# Patient Record
Sex: Male | Born: 1965 | Race: White | Hispanic: No | Marital: Married
Health system: Southern US, Community
[De-identification: ages and names within clinical notes are randomized; demographics above are authoritative.]

---

## 2006-11-14 ENCOUNTER — Emergency Department: Payer: Self-pay | Admitting: Emergency Medicine

## 2006-11-14 ENCOUNTER — Other Ambulatory Visit: Payer: Self-pay

## 2006-11-15 ENCOUNTER — Other Ambulatory Visit: Payer: Self-pay

## 2008-09-02 IMAGING — CT CT CHEST W/ CM
2 series · 16 of 32 positions shown, 20 images · IV contrast (APPLIED)
Comparison: none

REASON FOR EXAM: rm 19  chest pain
COMMENTS:

PROCEDURE:     CT  - CT CHEST (FOR PE) W  - November 15, 2006 [DATE]
RESULT:
HISTORY: Chest pain.

[Series 5: soft tissue · axial · 0.88mm/px · z∈[-746,-692]mm · 2 of 116 slices shown]
[im 9/116  mediastinal]
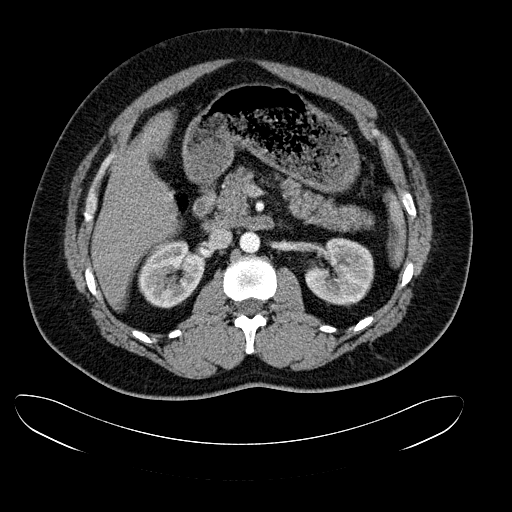
[im 27/116  mediastinal]
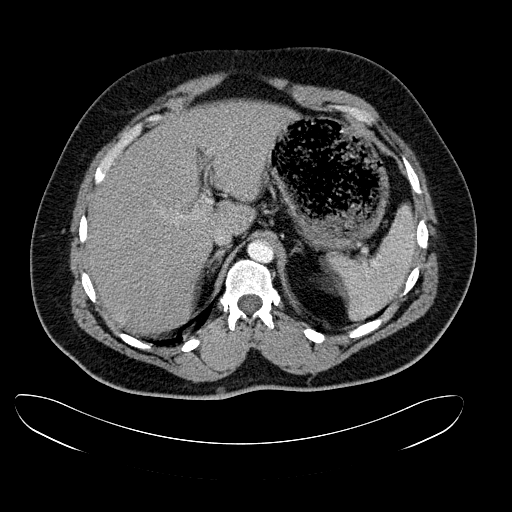

[Series 6: lung windows · axial · 0.88mm/px · z∈[-736,-452]mm · 14 of 113 slices shown, 18 images]
[im 9/113  mediastinal]
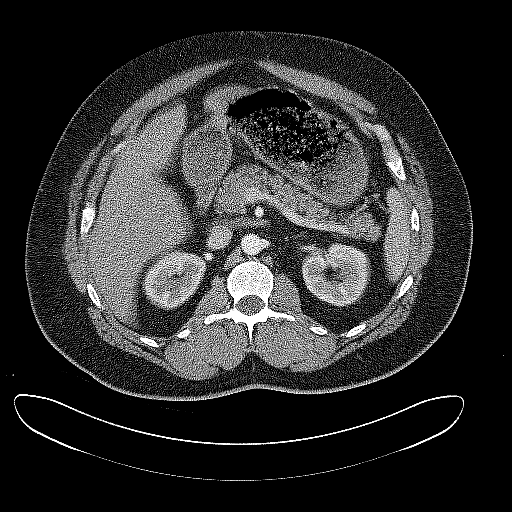
[im 9/113  lung]
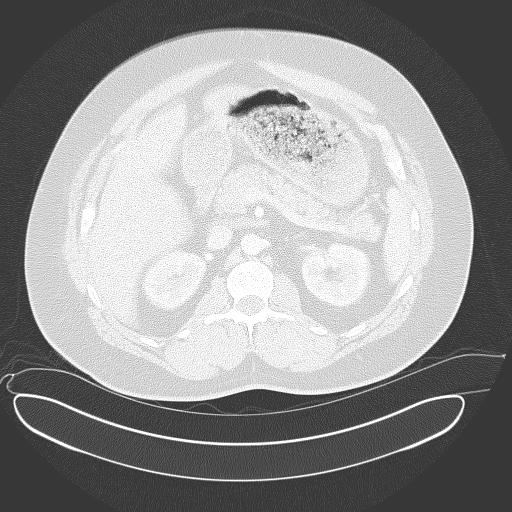
[im 18/113  lung]
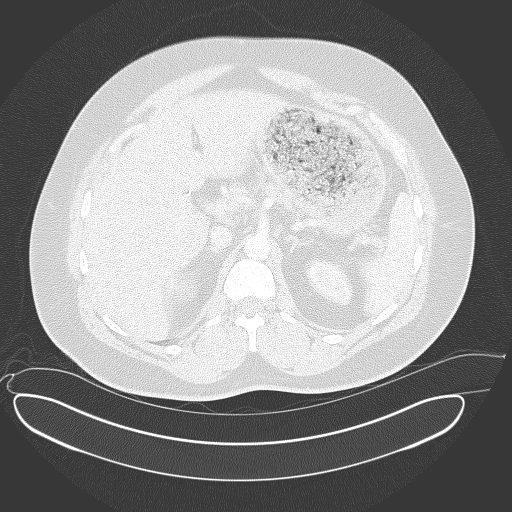
[im 26/113  lung]
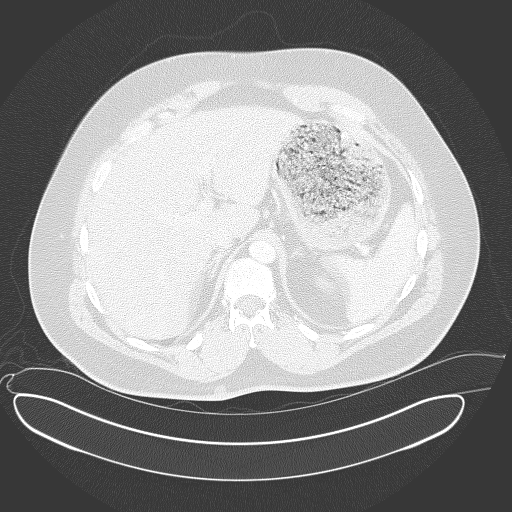
[im 35/113  lung]
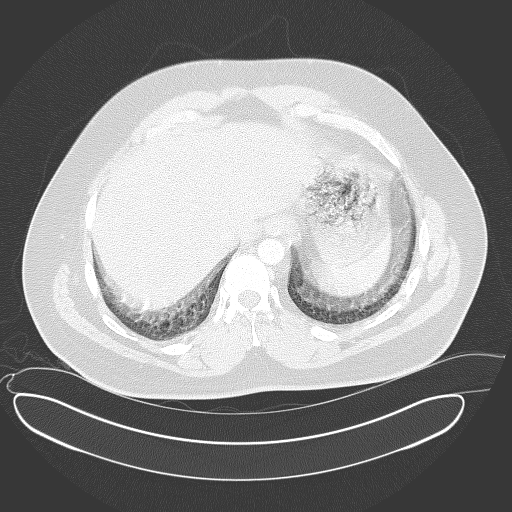
[im 44/113  mediastinal]
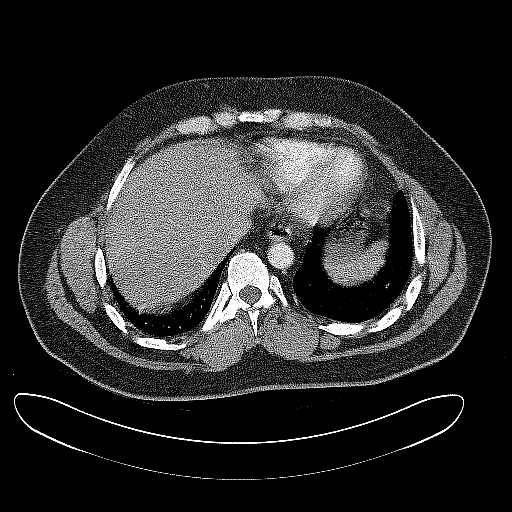
[im 44/113  lung]
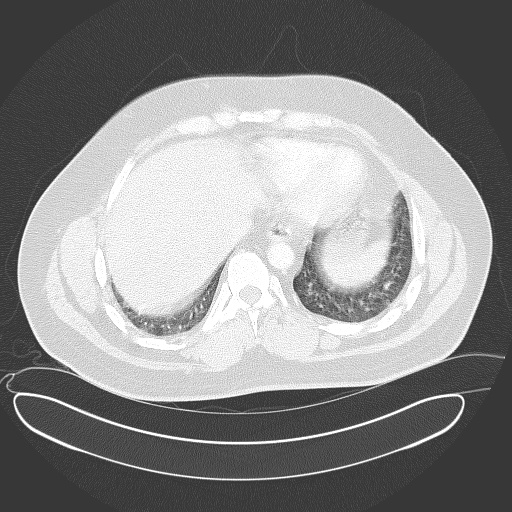
[im 52/113  lung]
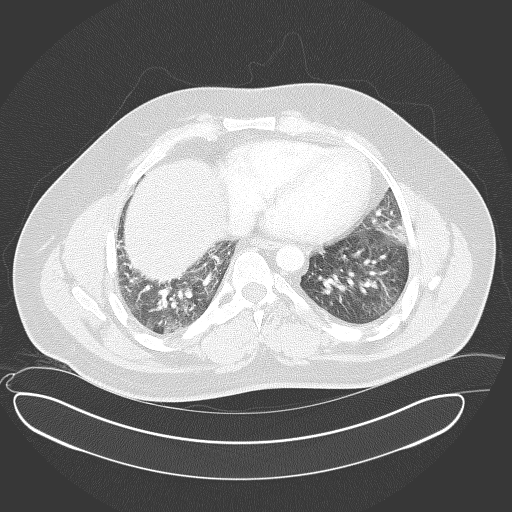
[im 53/113  lung]
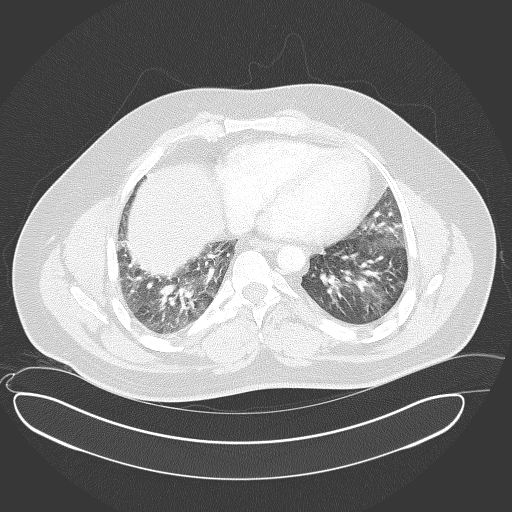
[im 57/113  lung]
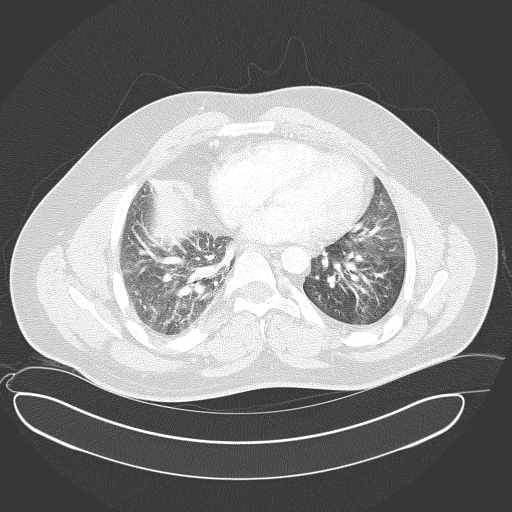
[im 61/113  mediastinal]
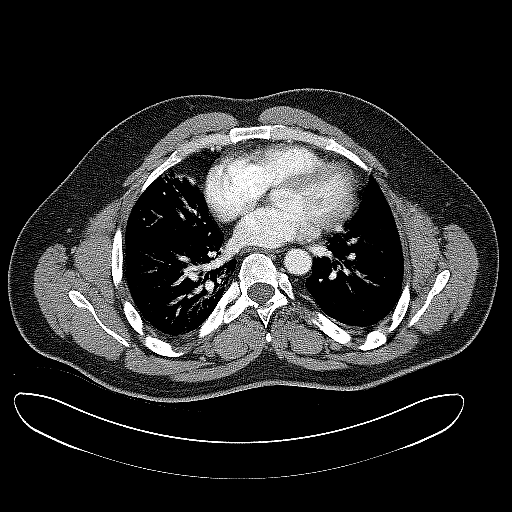
[im 61/113  lung]
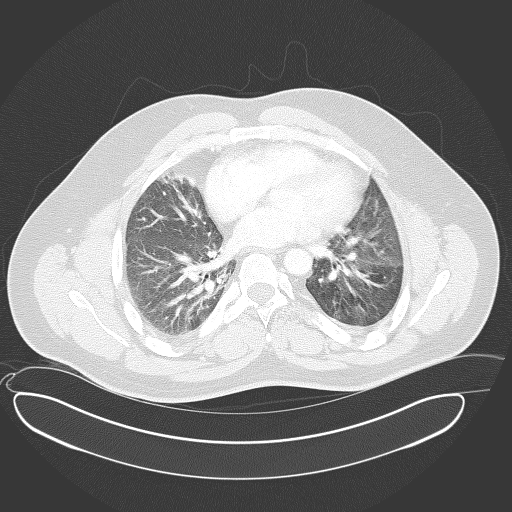
[im 69/113  lung]
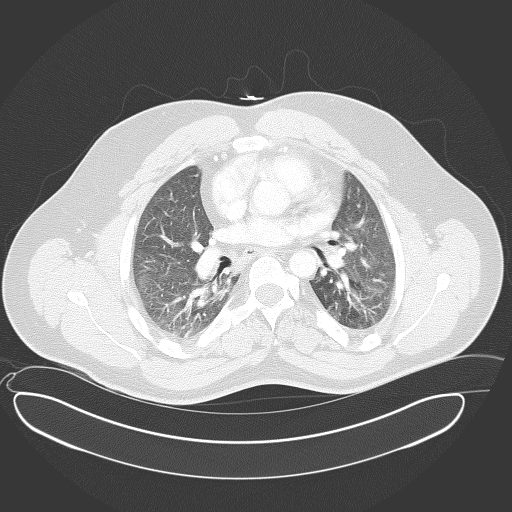
[im 78/113  lung]
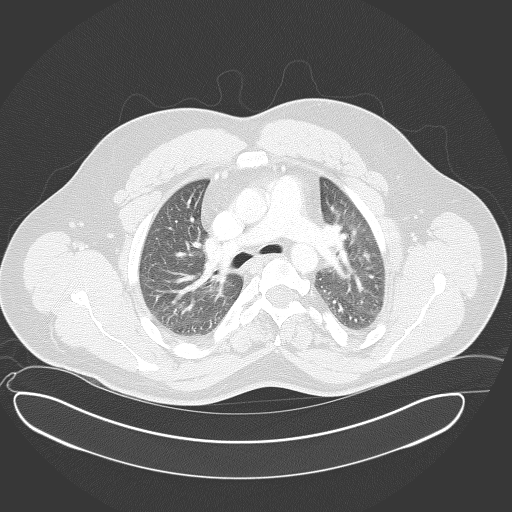
[im 87/113  lung]
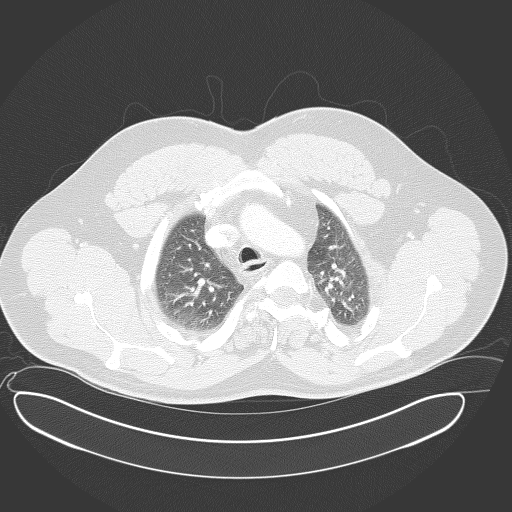
[im 95/113  mediastinal]
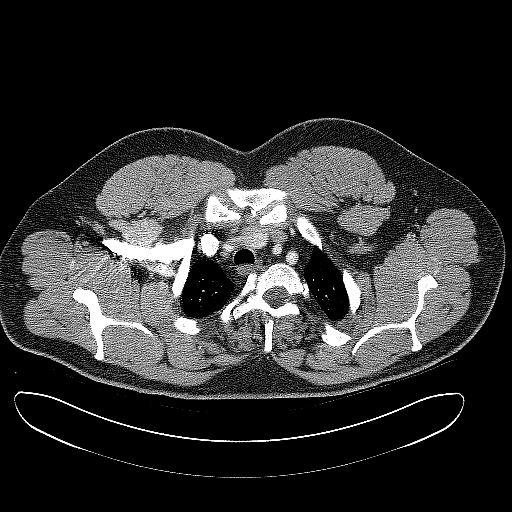
[im 95/113  lung]
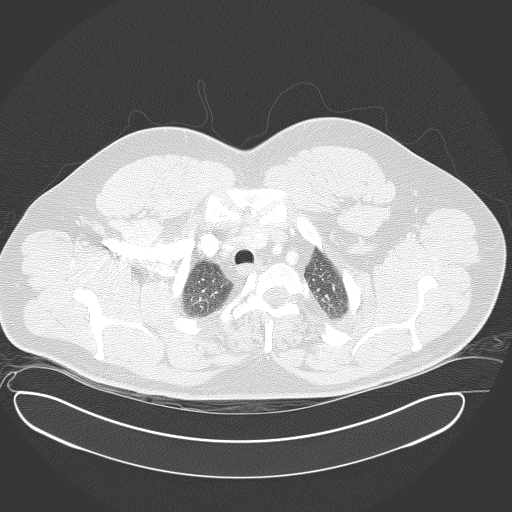
[im 104/113  lung]
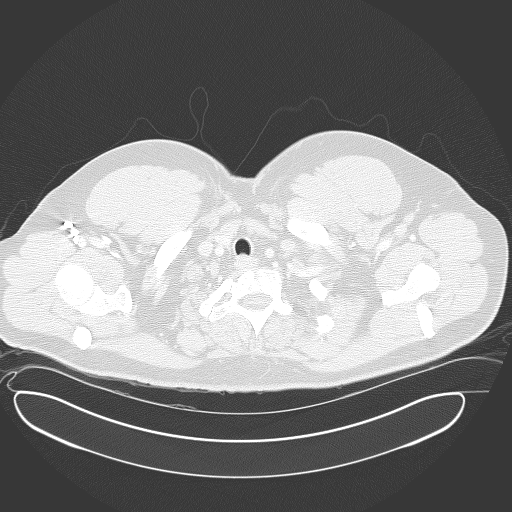

[16 of 32 positions shown; findings below may reference images not displayed]

FINDINGS: IV contrast enhanced chest CT is obtained.  The pulmonary
arteries are normal.  The heart size is normal.  No evidence of infiltrate
noted.  There is basilar atelectasis.  The adrenals are normal.
IMPRESSION: No evidence of pulmonary embolus.  Mild basilar atelectasis.

Initial report was faxed to the Emergency Room physician at the time of the
study.

## 2009-07-02 ENCOUNTER — Ambulatory Visit: Payer: Self-pay | Admitting: Unknown Physician Specialty

## 2009-07-09 ENCOUNTER — Ambulatory Visit: Payer: Self-pay | Admitting: Gastroenterology

## 2011-04-20 IMAGING — CT CT ABD-PELV W/ CM
1 of 2 series · 14 of 32 positions shown, 18 images · non-contrast
Comparison: none

REASON FOR EXAM: RLQ pain change in bowel habits diarrhea rectal bleeding
COMMENTS:

[Series 2: abdomen · axial · 0.87mm/px · z∈[-560,-145]mm · 14 of 91 slices shown, 18 images]
[im 4/91  soft-tissue]
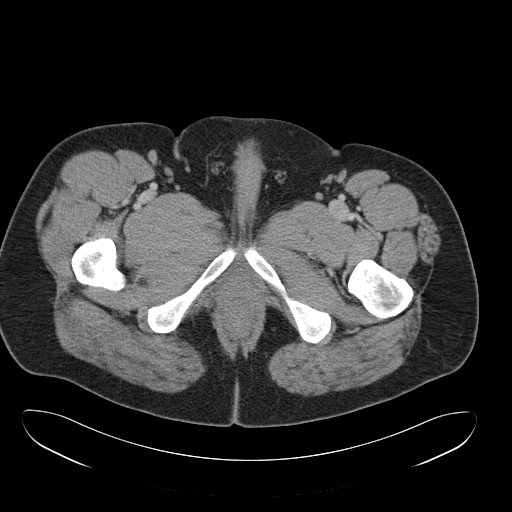
[im 4/91  bone]
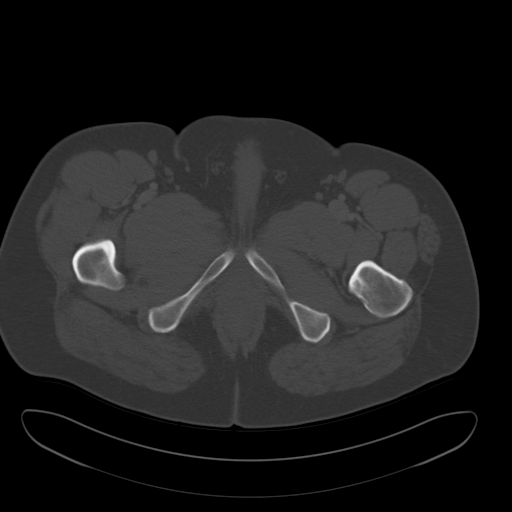
[im 12/91  soft-tissue]
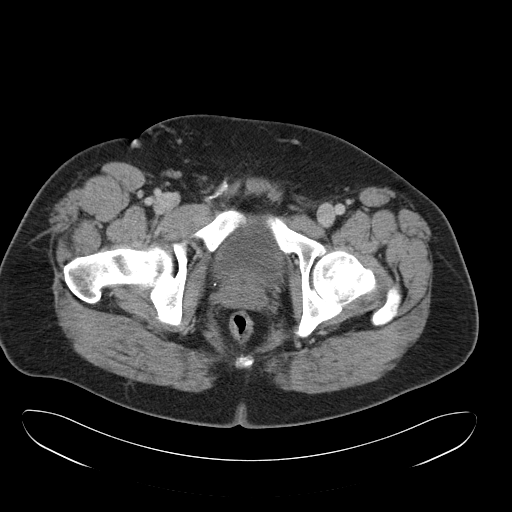
[im 19/91  soft-tissue]
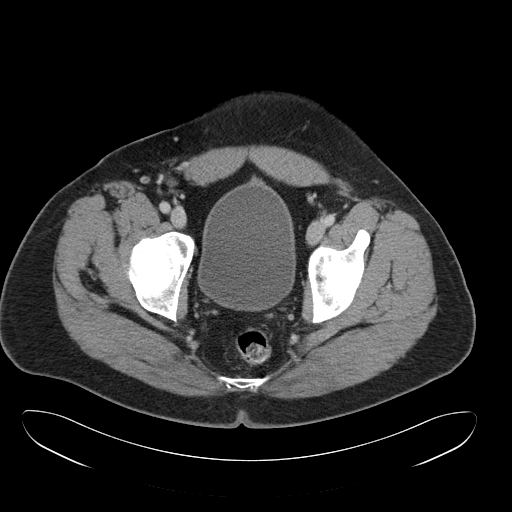
[im 27/91  soft-tissue]
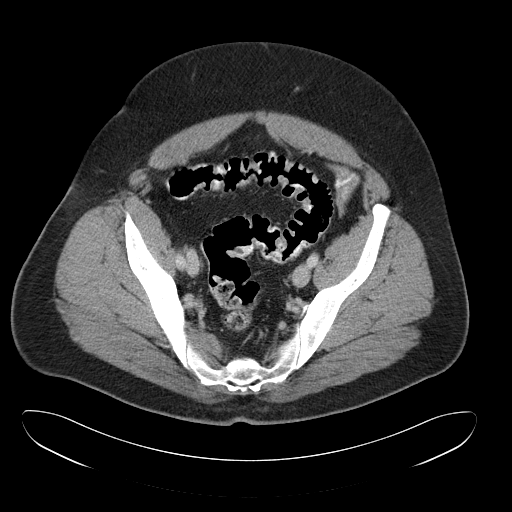
[im 34/91  soft-tissue]
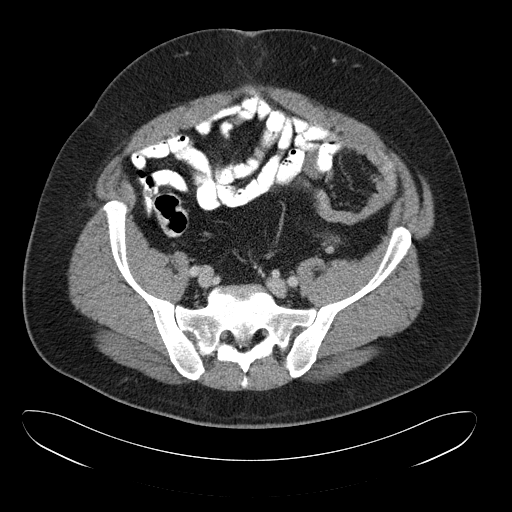
[im 42/91  soft-tissue]
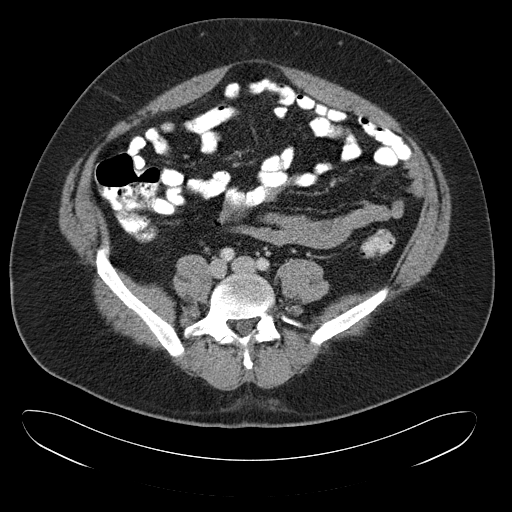
[im 49/91  soft-tissue]
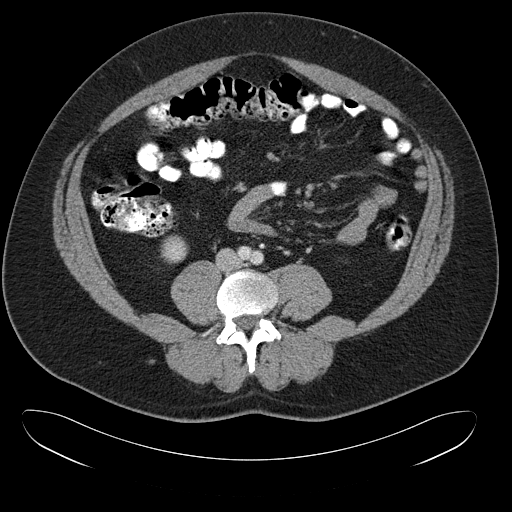
[im 57/91  soft-tissue]
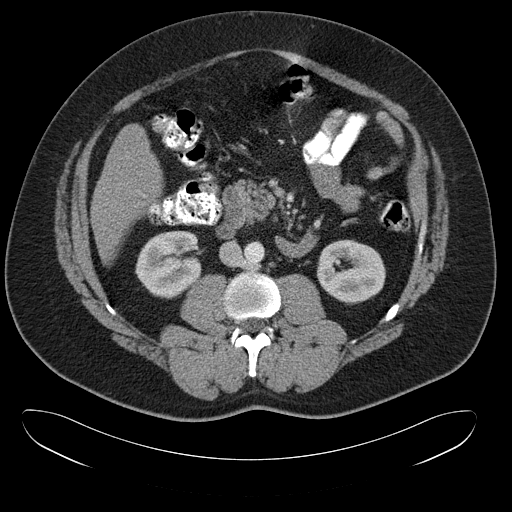
[im 64/91  soft-tissue]
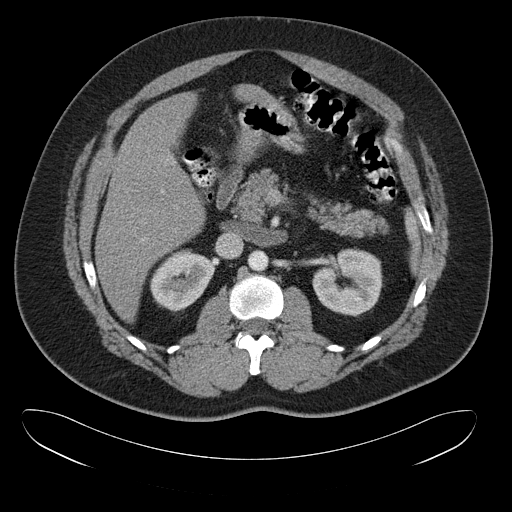
[im 64/91  bone]
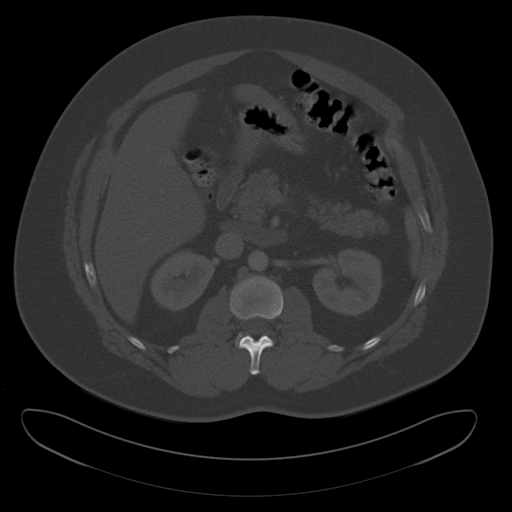
[im 72/91  soft-tissue]
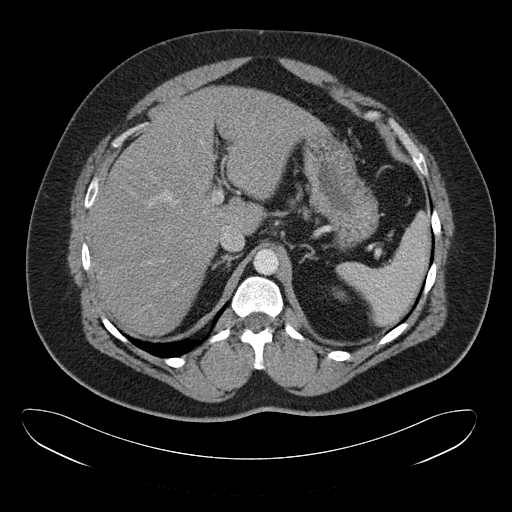
[im 76/91  lung]
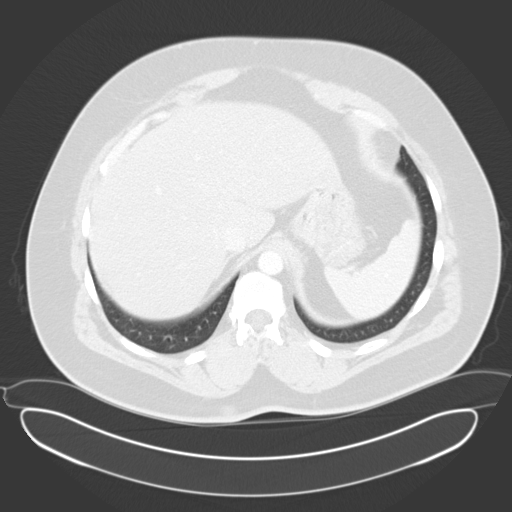
[im 79/91  soft-tissue]
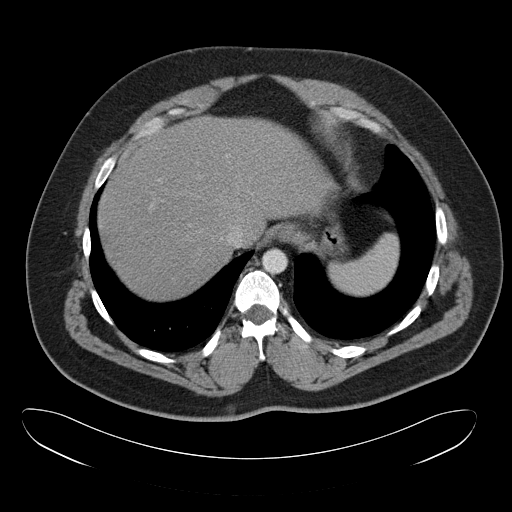
[im 79/91  lung]
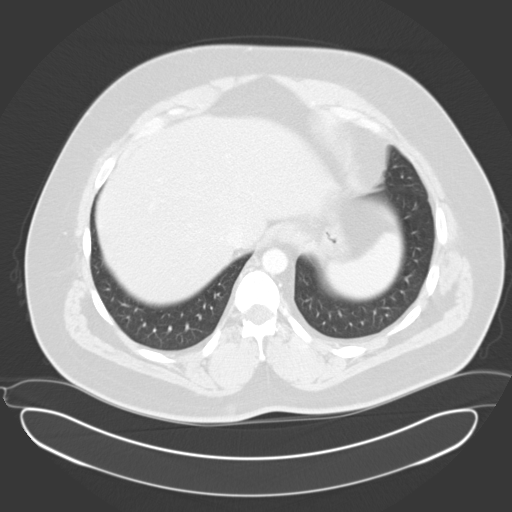
[im 83/91  lung]
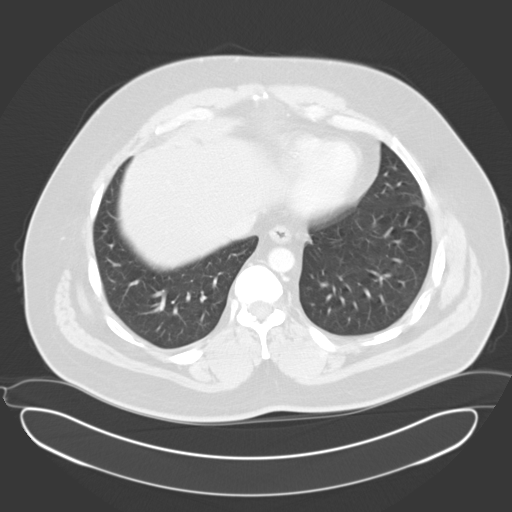
[im 87/91  soft-tissue]
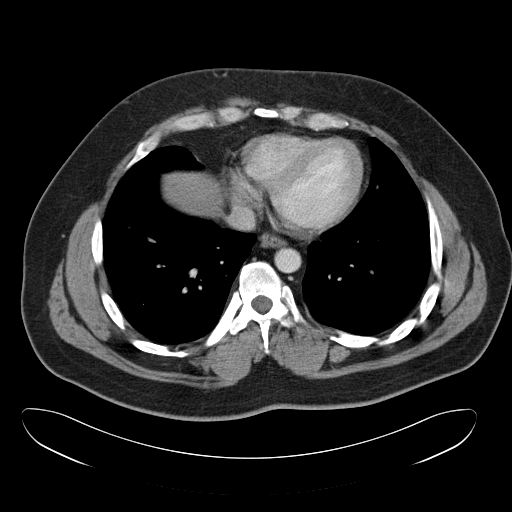
[im 87/91  lung]
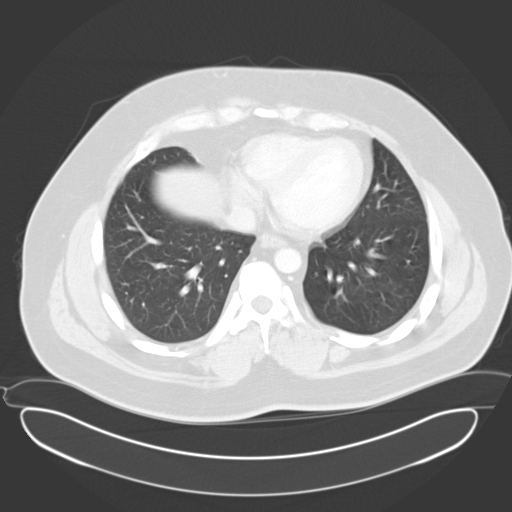

[14 of 32 positions shown; findings below may reference images not displayed]

PROCEDURE:     CT  - CT ABDOMEN / PELVIS  W  - July 02, 2009  [DATE]

RESULT:     Axial CT scanning was performed through the abdomen and pelvis
at 5 mm intervals and slice thicknesses following intravenous administration
of 100 cc of Isovue 370 and administration of oral contrast material. Review
of 3-dimensional reconstructed images was performed separately on the
[employer] monitor.

The liver exhibits normal density with no focal mass nor ductal dilation.
The gallbladder surgically absent. The pancreas, spleen, nondistended
stomach, adrenal glands, and kidneys exhibit no acute abnormality. There is
a nonobstructing 3 mm diameter stone in a mid pole calyx on the right. There
is an approximately 2 mm diameter nonobstructing stone in a mid pole calyx
on the left. The caliber of the abdominal aorta is normal.

The partially contrast-filled loops of small bowel are normal in appearance.
The cecum contains stool and contrast and is normal in contour. The appendix
is not discretely identified. There is radiodense material in the right
lower quadrant of the abdomen which may reflect surgical clips. The
transverse colon and ascending colon exhibit no acute abnormality. Minimal
thickening of the wall of the sigmoid colon is noted but I do not see
evidence of perisigmoidal inflammatory change. No more than a few
diverticula are demonstrated. There is no free fluid in the pelvis. The
partially distended urinary bladder is normal in appearance. The prostate
gland and seminal vesicles appear normal. I see no inguinal hernia. A
broadly neck umbilical hernia is present containing loops of small bowel but
there is no evidence of obstruction. The lumbar vertebral bodies are
preserved in height. The lung bases are clear.
IMPRESSION: 1. I do not see objective evidence of acute appendicitis nor acute
diverticulitis. I cannot exclude low-grade diverticulitis of the sigmoid
colon in the appropriate clinical setting, but no abscess or perforation or
obstruction is seen.
2. There is a broadly necked umbilical hernia containing fat and loops of
normal appearing contrast-filled small bowel.
3. I do not see evidence of acute hepatobiliary abnormality. The gallbladder
is surgically absent.
4. There are nonobstructing stones within both kidneys.

## 2019-07-04 ENCOUNTER — Encounter: Payer: Self-pay | Admitting: *Deleted

## 2019-07-04 ENCOUNTER — Ambulatory Visit: Payer: BLUE CROSS/BLUE SHIELD | Admitting: Certified Registered Nurse Anesthetist

## 2019-07-04 ENCOUNTER — Encounter
Admission: RE | Disposition: A | Discharge status: Home / Self Care | Payer: Self-pay | Source: Ambulatory Visit | Attending: Surgery

## 2019-07-04 ENCOUNTER — Ambulatory Visit
Admission: RE | Admit: 2019-07-04 | Discharge: 2019-07-04 | Disposition: A | Discharge status: Home / Self Care | Payer: BLUE CROSS/BLUE SHIELD | Source: Ambulatory Visit | Attending: Surgery | Admitting: Surgery

## 2019-07-04 ENCOUNTER — Other Ambulatory Visit: Payer: Self-pay

## 2019-07-04 ENCOUNTER — Ambulatory Visit: Payer: Self-pay | Admitting: Surgery

## 2019-07-04 ENCOUNTER — Other Ambulatory Visit
Admission: RE | Admit: 2019-07-04 | Discharge: 2019-07-04 | Disposition: A | Discharge status: Home / Self Care | Payer: BLUE CROSS/BLUE SHIELD | Source: Ambulatory Visit | Attending: Surgery | Admitting: Surgery

## 2019-07-04 DIAGNOSIS — F17290 Nicotine dependence, other tobacco product, uncomplicated: Secondary | ICD-10-CM | POA: Diagnosis not present

## 2019-07-04 DIAGNOSIS — L02212 Cutaneous abscess of back [any part, except buttock]: Secondary | ICD-10-CM | POA: Diagnosis not present

## 2019-07-04 DIAGNOSIS — Z20828 Contact with and (suspected) exposure to other viral communicable diseases: Secondary | ICD-10-CM | POA: Diagnosis not present

## 2019-07-04 HISTORY — PX: INCISION AND DRAINAGE ABSCESS: SHX5864

## 2019-07-04 LAB — SARS CORONAVIRUS 2 BY RT PCR (HOSPITAL ORDER, PERFORMED IN ~~LOC~~ HOSPITAL LAB): SARS Coronavirus 2: NEGATIVE

## 2019-07-04 SURGERY — INCISION AND DRAINAGE, ABSCESS
Anesthesia: General | Site: Back

## 2019-07-04 MED ORDER — FAMOTIDINE 20 MG PO TABS
ORAL_TABLET | ORAL | Status: AC
  Administered 2019-07-04: 20 mg via ORAL
  Filled 2019-07-04: qty 1

## 2019-07-04 MED ORDER — FAMOTIDINE 20 MG PO TABS
20.0000 mg | ORAL_TABLET | Freq: Once | ORAL | Status: AC
  Administered 2019-07-04: 13:00:00 20 mg via ORAL

## 2019-07-04 MED ORDER — PROPOFOL 10 MG/ML IV BOLUS
INTRAVENOUS | Status: AC
  Filled 2019-07-04: qty 20

## 2019-07-04 MED ORDER — LIDOCAINE HCL 1 % IJ SOLN
INTRAMUSCULAR | Status: DC | PRN
  Administered 2019-07-04: 5 mL

## 2019-07-04 MED ORDER — ROCURONIUM BROMIDE 50 MG/5ML IV SOLN
INTRAVENOUS | Status: AC
  Filled 2019-07-04: qty 1

## 2019-07-04 MED ORDER — SUGAMMADEX SODIUM 500 MG/5ML IV SOLN
INTRAVENOUS | Status: DC | PRN
  Administered 2019-07-04: 200 mg via INTRAVENOUS

## 2019-07-04 MED ORDER — OXYCODONE HCL 5 MG PO TABS
5.0000 mg | ORAL_TABLET | Freq: Once | ORAL | Status: AC | PRN
  Administered 2019-07-04: 5 mg via ORAL

## 2019-07-04 MED ORDER — CEFAZOLIN SODIUM-DEXTROSE 2-4 GM/100ML-% IV SOLN
INTRAVENOUS | Status: AC
  Filled 2019-07-04: qty 100

## 2019-07-04 MED ORDER — CEFAZOLIN SODIUM-DEXTROSE 2-4 GM/100ML-% IV SOLN
2.0000 g | INTRAVENOUS | Status: AC
  Administered 2019-07-04: 2 g via INTRAVENOUS

## 2019-07-04 MED ORDER — OXYCODONE HCL 5 MG/5ML PO SOLN: 5.0000 mg | Freq: Once | ORAL | Status: AC | PRN

## 2019-07-04 MED ORDER — SUCCINYLCHOLINE CHLORIDE 20 MG/ML IJ SOLN
INTRAMUSCULAR | Status: DC | PRN
  Administered 2019-07-04: 120 mg via INTRAVENOUS

## 2019-07-04 MED ORDER — LIDOCAINE HCL (PF) 2 % IJ SOLN
INTRAMUSCULAR | Status: AC
  Filled 2019-07-04: qty 10

## 2019-07-04 MED ORDER — MIDAZOLAM HCL 2 MG/2ML IJ SOLN
INTRAMUSCULAR | Status: AC
  Filled 2019-07-04: qty 2

## 2019-07-04 MED ORDER — OXYCODONE HCL 5 MG PO TABS
ORAL_TABLET | ORAL | Status: AC
  Filled 2019-07-04: qty 1

## 2019-07-04 MED ORDER — DOCUSATE SODIUM 100 MG PO CAPS: 100.0000 mg | ORAL_CAPSULE | Freq: Two times a day (BID) | ORAL | 0 refills | Status: AC | PRN

## 2019-07-04 MED ORDER — FENTANYL CITRATE (PF) 100 MCG/2ML IJ SOLN
INTRAMUSCULAR | Status: AC
  Filled 2019-07-04: qty 2

## 2019-07-04 MED ORDER — ACETAMINOPHEN 325 MG PO TABS: 650.0000 mg | ORAL_TABLET | Freq: Three times a day (TID) | ORAL | 0 refills | Status: AC | PRN

## 2019-07-04 MED ORDER — ROCURONIUM BROMIDE 50 MG/5ML IV SOLN
INTRAVENOUS | Status: AC
  Filled 2019-07-04: qty 2

## 2019-07-04 MED ORDER — FENTANYL CITRATE (PF) 100 MCG/2ML IJ SOLN: 25.0000 ug | INTRAMUSCULAR | Status: DC | PRN

## 2019-07-04 MED ORDER — PROMETHAZINE HCL 25 MG/ML IJ SOLN: 6.2500 mg | INTRAMUSCULAR | Status: DC | PRN

## 2019-07-04 MED ORDER — MIDAZOLAM HCL 2 MG/2ML IJ SOLN
INTRAMUSCULAR | Status: DC | PRN
  Administered 2019-07-04: 2 mg via INTRAVENOUS

## 2019-07-04 MED ORDER — HYDROCODONE-ACETAMINOPHEN 5-325 MG PO TABS: 1.0000 | ORAL_TABLET | Freq: Four times a day (QID) | ORAL | 0 refills | Status: DC | PRN

## 2019-07-04 MED ORDER — ONDANSETRON HCL 4 MG/2ML IJ SOLN
INTRAMUSCULAR | Status: AC
  Filled 2019-07-04: qty 2

## 2019-07-04 MED ORDER — ONDANSETRON HCL 4 MG/2ML IJ SOLN
INTRAMUSCULAR | Status: DC | PRN
  Administered 2019-07-04: 4 mg via INTRAVENOUS

## 2019-07-04 MED ORDER — IBUPROFEN 800 MG PO TABS: 800.0000 mg | ORAL_TABLET | Freq: Three times a day (TID) | ORAL | 0 refills | Status: AC | PRN

## 2019-07-04 MED ORDER — DEXAMETHASONE SODIUM PHOSPHATE 10 MG/ML IJ SOLN
INTRAMUSCULAR | Status: AC
  Filled 2019-07-04: qty 2

## 2019-07-04 MED ORDER — LIDOCAINE HCL (PF) 1 % IJ SOLN
INTRAMUSCULAR | Status: AC
  Filled 2019-07-04: qty 30

## 2019-07-04 MED ORDER — ROCURONIUM BROMIDE 100 MG/10ML IV SOLN
INTRAVENOUS | Status: DC | PRN
  Administered 2019-07-04: 20 mg via INTRAVENOUS
  Administered 2019-07-04: 5 mg via INTRAVENOUS

## 2019-07-04 MED ORDER — DEXAMETHASONE SODIUM PHOSPHATE 10 MG/ML IJ SOLN
INTRAMUSCULAR | Status: DC | PRN
  Administered 2019-07-04: 10 mg via INTRAVENOUS

## 2019-07-04 MED ORDER — PROPOFOL 10 MG/ML IV BOLUS
INTRAVENOUS | Status: DC | PRN
  Administered 2019-07-04: 200 mg via INTRAVENOUS

## 2019-07-04 MED ORDER — ONDANSETRON HCL 4 MG/2ML IJ SOLN
INTRAMUSCULAR | Status: AC
  Filled 2019-07-04: qty 4

## 2019-07-04 MED ORDER — LACTATED RINGERS IV SOLN
INTRAVENOUS | Status: DC
  Administered 2019-07-04: 13:00:00 via INTRAVENOUS

## 2019-07-04 MED ORDER — BUPIVACAINE-EPINEPHRINE (PF) 0.25% -1:200000 IJ SOLN
INTRAMUSCULAR | Status: AC
  Filled 2019-07-04: qty 30

## 2019-07-04 MED ORDER — ESMOLOL HCL 100 MG/10ML IV SOLN
INTRAVENOUS | Status: DC | PRN
  Administered 2019-07-04 (×2): 10 mg via INTRAVENOUS

## 2019-07-04 MED ORDER — DEXMEDETOMIDINE HCL 200 MCG/2ML IV SOLN
INTRAVENOUS | Status: DC | PRN
  Administered 2019-07-04: 4 ug via INTRAVENOUS

## 2019-07-04 MED ORDER — CHLORHEXIDINE GLUCONATE CLOTH 2 % EX PADS: 6.0000 | MEDICATED_PAD | Freq: Once | CUTANEOUS | Status: DC

## 2019-07-04 MED ORDER — SUCCINYLCHOLINE CHLORIDE 20 MG/ML IJ SOLN
INTRAMUSCULAR | Status: AC
  Filled 2019-07-04: qty 2

## 2019-07-04 MED ORDER — FENTANYL CITRATE (PF) 100 MCG/2ML IJ SOLN
INTRAMUSCULAR | Status: DC | PRN
  Administered 2019-07-04 (×2): 50 ug via INTRAVENOUS

## 2019-07-04 MED ORDER — ALBUTEROL SULFATE HFA 108 (90 BASE) MCG/ACT IN AERS
INHALATION_SPRAY | RESPIRATORY_TRACT | Status: DC | PRN
  Administered 2019-07-04: 4 via RESPIRATORY_TRACT

## 2019-07-04 MED ORDER — LIDOCAINE HCL (CARDIAC) PF 100 MG/5ML IV SOSY
PREFILLED_SYRINGE | INTRAVENOUS | Status: DC | PRN
  Administered 2019-07-04: 100 mg via INTRAVENOUS

## 2019-07-04 MED ORDER — BUPIVACAINE-EPINEPHRINE 0.25% -1:200000 IJ SOLN
INTRAMUSCULAR | Status: DC | PRN
  Administered 2019-07-04: 5 mL

## 2019-07-04 MED ORDER — MEPERIDINE HCL 50 MG/ML IJ SOLN: 6.2500 mg | INTRAMUSCULAR | Status: DC | PRN

## 2019-07-04 SURGICAL SUPPLY — 28 items
BLADE SURG 15 STRL LF DISP TIS (BLADE) ×1 IMPLANT
BLADE SURG 15 STRL SS (BLADE) ×2
CANISTER SUCT 1200ML W/VALVE (MISCELLANEOUS) ×3 IMPLANT
CHLORAPREP W/TINT 26 (MISCELLANEOUS) ×3 IMPLANT
COVER WAND RF STERILE (DRAPES) ×3 IMPLANT
DRAPE LAPAROTOMY 100X77 ABD (DRAPES) ×3 IMPLANT
ELECT REM PT RETURN 9FT ADLT (ELECTROSURGICAL) ×3
ELECTRODE REM PT RTRN 9FT ADLT (ELECTROSURGICAL) ×1 IMPLANT
GAUZE PACKING IODOFORM 1/2 (PACKING) IMPLANT
GAUZE PACKING IODOFORM 1X5 (MISCELLANEOUS) ×3 IMPLANT
GAUZE SPONGE 4X4 12PLY STRL (GAUZE/BANDAGES/DRESSINGS) ×3 IMPLANT
GLOVE BIOGEL PI IND STRL 7.0 (GLOVE) ×1 IMPLANT
GLOVE BIOGEL PI INDICATOR 7.0 (GLOVE) ×2
GLOVE SURG SYN 6.5 ES PF (GLOVE) ×6 IMPLANT
GOWN STRL REUS W/ TWL LRG LVL3 (GOWN DISPOSABLE) ×2 IMPLANT
GOWN STRL REUS W/TWL LRG LVL3 (GOWN DISPOSABLE) ×4
LABEL OR SOLS (LABEL) ×3 IMPLANT
NEEDLE HYPO 22GX1.5 SAFETY (NEEDLE) ×3 IMPLANT
NS IRRIG 500ML POUR BTL (IV SOLUTION) ×3 IMPLANT
PACK BASIN MINOR ARMC (MISCELLANEOUS) ×3 IMPLANT
SUT MNCRL 4-0 (SUTURE) ×2
SUT MNCRL 4-0 27XMFL (SUTURE) ×1
SUT VIC AB 2-0 CT2 27 (SUTURE) ×3 IMPLANT
SUT VIC AB 3-0 SH 27 (SUTURE) ×2
SUT VIC AB 3-0 SH 27X BRD (SUTURE) ×1 IMPLANT
SUTURE MNCRL 4-0 27XMF (SUTURE) ×1 IMPLANT
SYR 10ML LL (SYRINGE) ×3 IMPLANT
TOWEL OR 17X26 4PK STRL BLUE (TOWEL DISPOSABLE) ×3 IMPLANT

## 2019-07-04 NOTE — Anesthesia Post-op Follow-up Note (Signed)
Anesthesia QCDR form completed.        

## 2019-07-04 NOTE — Anesthesia Preprocedure Evaluation (Signed)
Anesthesia Evaluation  Patient identified by MRN, date of birth, ID band Patient awake    Reviewed: Allergy & Precautions, NPO status , Patient's Chart, lab work & pertinent test results  History of Anesthesia Complications Negative for: history of anesthetic complications  Airway Mallampati: III  TM Distance: >3 FB Neck ROM: Full    Dental no notable dental hx.    Pulmonary neg sleep apnea, neg COPD, Current Smoker (occasional cigar) and Patient abstained from smoking.,    breath sounds clear to auscultation- rhonchi (-) wheezing      Cardiovascular Exercise Tolerance: Good (-) hypertension(-) CAD, (-) Past MI, (-) Cardiac Stents and (-) CABG  Rhythm:Regular Rate:Normal - Systolic murmurs and - Diastolic murmurs    Neuro/Psych neg Seizures negative neurological ROS  negative psych ROS   GI/Hepatic negative GI ROS, Neg liver ROS,   Endo/Other  negative endocrine ROSneg diabetes  Renal/GU negative Renal ROS     Musculoskeletal negative musculoskeletal ROS (+)   Abdominal (+) + obese,   Peds  Hematology negative hematology ROS (+)   Anesthesia Other Findings   Reproductive/Obstetrics                             Anesthesia Physical Anesthesia Plan  ASA: II  Anesthesia Plan: General   Post-op Pain Management:    Induction: Intravenous  PONV Risk Score and Plan: 1 and Ondansetron, Dexamethasone and Midazolam  Airway Management Planned: Oral ETT  Additional Equipment:   Intra-op Plan:   Post-operative Plan: Extubation in OR  Informed Consent: I have reviewed the patients History and Physical, chart, labs and discussed the procedure including the risks, benefits and alternatives for the proposed anesthesia with the patient or authorized representative who has indicated his/her understanding and acceptance.     Dental advisory given  Plan Discussed with: CRNA and  Anesthesiologist  Anesthesia Plan Comments:         Anesthesia Quick Evaluation

## 2019-07-04 NOTE — Interval H&P Note (Signed)
History and Physical Interval Note:  07/04/2019 12:52 PM  Andre Carr  has presented today for surgery, with the diagnosis of L02.212 Back abscess.  The various methods of treatment have been discussed with the patient and family. After consideration of risks, benefits and other options for treatment, the patient has consented to  Procedure(s): INCISION AND DRAINAGE ABSCESS (N/A) as a surgical intervention.  The patient's history has been reviewed, patient examined, no change in status, stable for surgery.  I have reviewed the patient's chart and labs.  Questions were answered to the patient's satisfaction.     Michah Minton Lysle Pearl

## 2019-07-04 NOTE — Anesthesia Procedure Notes (Signed)
Procedure Name: Intubation Date/Time: 07/04/2019 1:46 PM Performed by: Lily Peer, Summer, RN Pre-anesthesia Checklist: Patient identified, Patient being monitored, Timeout performed, Emergency Drugs available and Suction available Patient Re-evaluated:Patient Re-evaluated prior to induction Oxygen Delivery Method: Circle system utilized Preoxygenation: Pre-oxygenation with 100% oxygen Induction Type: IV induction Ventilation: Mask ventilation without difficulty Laryngoscope Size: McGraph and 4 Grade View: Grade I Tube type: Oral Tube size: 7.5 mm Number of attempts: 1 Airway Equipment and Method: Stylet Placement Confirmation: ETT inserted through vocal cords under direct vision,  positive ETCO2 and breath sounds checked- equal and bilateral Secured at: 23 cm Tube secured with: Tape Dental Injury: Teeth and Oropharynx as per pre-operative assessment

## 2019-07-04 NOTE — Discharge Instructions (Signed)
I&D, Care After This sheet gives you information about how to care for yourself after your procedure. Your health care provider may also give you more specific instructions. If you have problems or questions, contact your health care provider. What can I expect after the procedure? After the procedure, it is common to have:  Soreness.  Bruising.  Itching. Follow these instructions at home: site care Follow instructions from your health care provider about how to take care of your site. Make sure you:  Wash your hands with soap and water before and after you change your bandage (dressing). If soap and water are not available, use hand sanitizer.  Leave dressing in place until f/u.  If the area bleeds or bruises, apply gentle pressure for 10 minutes.   Check your site every day for signs of infection. Check for:  Redness, swelling, or pain.  Fluid or blood.  Warmth.  Pus or a bad smell.  General instructions  Rest and then return to your normal activities as told by your health care provider.   tylenol and advil as needed for discomfort.  Please alternate between the two every four hours as needed for pain.     Use narcotics, if prescribed, only when tylenol and motrin is not enough to control pain.   325-650mg  every 8hrs to max of 3000mg /24hrs (including the 325mg  in every norco dose) for the tylenol.     Advil up to 800mg  per dose every 8hrs as needed for pain.    Keep all follow-up visits as told by your health care provider. This is important. Contact a health care provider if:  You have redness, swelling, or pain around your site.  You have fluid or blood coming from your site.  Your site feels warm to the touch.  You have pus or a bad smell coming from your site.  You have a fever.  Your sutures, skin glue, or adhesive strips loosen or come off sooner than expected. Get help right away if:  You have bleeding that does not stop with pressure or a  dressing. Summary  After the procedure, it is common to have some soreness, bruising, and itching at the site.  Follow instructions from your health care provider about how to take care of your site.  Check your site every day for signs of infection.  Contact a health care provider if you have redness, swelling, or pain around your site, or your site feels warm to the touch.  Keep all follow-up visits as told by your health care provider. This is important. This information is not intended to replace advice given to you by your health care provider. Make sure you discuss any questions you have with your health care provider. Document Released: 08/16/2015 Document Revised: 01/17/2018 Document Reviewed: 01/17/2018 Elsevier Interactive Patient Education  2019 [employer]  AMBULATORY SURGERY  DISCHARGE INSTRUCTIONS   1) The drugs that you were given will stay in your system until tomorrow so for the next 24 hours you should not:  A) Drive an automobile B) Make any legal decisions C) Drink any alcoholic beverage   2) You may resume regular meals tomorrow.  Today it is better to start with liquids and gradually work up to solid foods.  You may eat anything you prefer, but it is better to start with liquids, then soup and crackers, and gradually work up to solid foods.   3) Please notify your doctor immediately if you have any unusual bleeding, trouble breathing,  redness and pain at the surgery site, drainage, fever, or pain not relieved by medication.    4) Additional Instructions:        Please contact your physician with any problems or Same Day Surgery at [phone], Monday through Friday 6 am to 4 pm, or [place] at [facility] number at [phone].

## 2019-07-04 NOTE — Transfer of Care (Signed)
Immediate Anesthesia Transfer of Care Note  Patient: Andre Carr  Procedure(s) Performed: INCISION AND DRAINAGE ABSCESS (N/A Back)  Patient Location: PACU  Anesthesia Type:General  Level of Consciousness: awake and patient cooperative  Airway & Oxygen Therapy: Patient Spontanous Breathing and Patient connected to face mask oxygen  Post-op Assessment: Report given to RN and Post -op Vital signs reviewed and stable  Post vital signs: Reviewed and stable  Last Vitals:  Vitals Value Taken Time  BP 147/98 07/04/19 1426  Temp 36.5 C 07/04/19 1426  Pulse 113 07/04/19 1431  Resp 26 07/04/19 1431  SpO2 99 % 07/04/19 1431  Vitals shown include unvalidated device data.  Last Pain:  Vitals:   07/04/19 1426  TempSrc:   PainSc: 0-No pain      Patients Stated Pain Goal: 0 (39/[id number])  Complications: No apparent anesthesia complications

## 2019-07-04 NOTE — H&P (Signed)
Subjective:   CC: Back abscess [L02.212]  HPI:  Andre Carr is a 53 y.o. male who was referred by Ronn Melena, MD for evaluation of above. First noted several years ago, with occasional increase in size and drainage.  Most recent episode started few days ago.  Minimal pain.  Exacerbated by pressure.  Alleviated by nothing specific.  Associated with drainage..     Past Medical History:  has a past medical history of Skin abscess.  Past Surgical History:  has a past surgical history that includes Appendectomy; Hernia repair; and Cholecystectomy.  Family History: reviewed and not related to cc  Social History:  reports that he has been smoking cigars. He has never used smokeless tobacco. No history on file for alcohol and drug.  Current Medications: has a current medication list which includes the following prescription(s): acetaminophen and multivitamin.  Allergies:  No Known Allergies  ROS:  A 15 point review of systems was performed and pertinent positives and negatives noted in HPI   Objective:   BP 156/84   Pulse 108   Ht 182.9 cm (6')   Wt (!) 121.6 kg (268 lb)   BMI 36.35 kg/m   Constitutional :  alert, appears stated age, cooperative and no distress  Lymphatics/Throat:  no asymmetry, masses, or scars  Respiratory:  clear to auscultation bilaterally  Cardiovascular:  regular rate and rhythm  Gastrointestinal: soft, non-tender; bowel sounds normal; no masses,  no organomegaly.    Musculoskeletal: Steady gait and movement  Skin: Cool and moist, large infected abscess on mid back measuring approximately 13cm x 10cm.  Psychiatric: Normal affect, non-agitated, not confused       LABS:  n/a   RADS: n/a  Assessment:      Back abscess [L02.212]  Plan:   1. Back abscess [L02.212] Discussed surgical I&D.  Alternatives include none.  Benefits include possible symptom relief, pathologic evaluation, improved cosmesis. Discussed the risk of  surgery including recurrence, chronic pain, post-op infxn, poor cosmesis, poor/delayed wound healing, and possible re-operation to address said risks. The risks of general anesthetic, if used, includes MI, CVA, sudden death or even reaction to anesthetic medications also discussed.  Typical post-op recovery time with dressing changes also discussed.  The patient verbalized understanding and all questions were answered to the patient's satisfaction.  2. Patient has elected to proceed with surgical treatment. Procedure will be scheduled urgently.  OR due to size and chronicity. Written consent was obtained.     Electronically signed by Benjamine Sprague, DO on 07/04/2019 11:21 AM

## 2019-07-04 NOTE — H&P (View-Only) (Signed)
Subjective:   CC: Back abscess [L02.212]  HPI:  Andre Carr is a 53 y.o. male who was referred by Khary Stephen Carew, MD for evaluation of above. First noted several years ago, with occasional increase in size and drainage.  Most recent episode started few days ago.  Minimal pain.  Exacerbated by pressure.  Alleviated by nothing specific.  Associated with drainage..     Past Medical History:  has a past medical history of Skin abscess.  Past Surgical History:  has a past surgical history that includes Appendectomy; Hernia repair; and Cholecystectomy.  Family History: reviewed and not related to cc  Social History:  reports that he has been smoking cigars. He has never used smokeless tobacco. No history on file for alcohol and drug.  Current Medications: has a current medication list which includes the following prescription(s): acetaminophen and multivitamin.  Allergies:  No Known Allergies  ROS:  A 15 point review of systems was performed and pertinent positives and negatives noted in HPI   Objective:   BP 156/84   Pulse 108   Ht 182.9 cm (6')   Wt (!) 121.6 kg (268 lb)   BMI 36.35 kg/m   Constitutional :  alert, appears stated age, cooperative and no distress  Lymphatics/Throat:  no asymmetry, masses, or scars  Respiratory:  clear to auscultation bilaterally  Cardiovascular:  regular rate and rhythm  Gastrointestinal: soft, non-tender; bowel sounds normal; no masses,  no organomegaly.    Musculoskeletal: Steady gait and movement  Skin: Cool and moist, large infected abscess on mid back measuring approximately 13cm x 10cm.  Psychiatric: Normal affect, non-agitated, not confused       LABS:  n/a   RADS: n/a  Assessment:      Back abscess [L02.212]  Plan:   1. Back abscess [L02.212] Discussed surgical I&D.  Alternatives include none.  Benefits include possible symptom relief, pathologic evaluation, improved cosmesis. Discussed the risk of  surgery including recurrence, chronic pain, post-op infxn, poor cosmesis, poor/delayed wound healing, and possible re-operation to address said risks. The risks of general anesthetic, if used, includes MI, CVA, sudden death or even reaction to anesthetic medications also discussed.  Typical post-op recovery time with dressing changes also discussed.  The patient verbalized understanding and all questions were answered to the patient's satisfaction.  2. Patient has elected to proceed with surgical treatment. Procedure will be scheduled urgently.  OR due to size and chronicity. Written consent was obtained.     Electronically signed by Dalayah Deahl, DO on 07/04/2019 11:21 AM    

## 2019-07-05 ENCOUNTER — Encounter: Payer: Self-pay | Admitting: Surgery

## 2019-07-05 NOTE — Anesthesia Postprocedure Evaluation (Signed)
Anesthesia Post Note  Patient: Andre Carr  Procedure(s) Performed: INCISION AND DRAINAGE ABSCESS (N/A Back)  Patient location during evaluation: PACU Anesthesia Type: General Level of consciousness: awake and alert and oriented Pain management: pain level controlled Vital Signs Assessment: post-procedure vital signs reviewed and stable Respiratory status: spontaneous breathing, nonlabored ventilation and respiratory function stable Cardiovascular status: blood pressure returned to baseline and stable Postop Assessment: no signs of nausea or vomiting Anesthetic complications: no     Last Vitals:  Vitals:   07/04/19 1515 07/04/19 1543  BP: (!) 157/90 (!) 164/96  Pulse: (!) 112 (!) 116  Resp: 18 18  Temp: 36.5 C   SpO2: 94% 94%    Last Pain:  Vitals:   07/04/19 1543  TempSrc:   PainSc: 3                  Janell Keeling

## 2019-07-06 NOTE — Op Note (Signed)
Preoperative diagnosis: Back abscess Postoperative diagnosis: same  Procedure: Incision and drainage of back abscess  Anesthesia: GETA  Surgeon: Lysle Pearl  Wound Classification: Contaminated  Indications: Patient is a 53 y.o. male  presented with above.  See H&P for further details.  Specimen: Wound culture  Complications: None  Estimated Blood Loss: 10 mL  Findings:  1.  As above 2. purulent secretions drained and cultured 3. Adequate hemostasis.   Description of procedure: The patient was placed in the right lateral position and GETA anesthesia was induced. The area was prepped and draped in the usual sterile fashion. A timeout was completed verifying correct patient, procedure, site, positioning, and implant(s) and/or special equipment prior to beginning this procedure.  Local infused over planned incision site. 8cm  Inicision made and purulent secretions was drained. Cultures taken.  With a hemostat blunt dissection of septas performed to drain the abscess completely. The wound then irrigated, hemostasis confirmed and then packed with an iodine packing, dressed with abd pads and secured with paper tape.  The patient tolerated the procedure well and was taken to the postanesthesia care unit in satisfactory condition.

## 2019-07-09 LAB — AEROBIC/ANAEROBIC CULTURE W GRAM STAIN (SURGICAL/DEEP WOUND)

## 2021-10-31 DIAGNOSIS — R400 Somnolence: Secondary | ICD-10-CM | POA: Diagnosis not present

## 2021-11-04 DIAGNOSIS — G4733 Obstructive sleep apnea (adult) (pediatric): Secondary | ICD-10-CM | POA: Diagnosis not present

## 2021-11-04 DIAGNOSIS — E118 Type 2 diabetes mellitus with unspecified complications: Secondary | ICD-10-CM | POA: Diagnosis not present

## 2021-11-04 DIAGNOSIS — R400 Somnolence: Secondary | ICD-10-CM | POA: Diagnosis not present

## 2021-11-04 DIAGNOSIS — I10 Essential (primary) hypertension: Secondary | ICD-10-CM | POA: Diagnosis not present

## 2021-12-03 DIAGNOSIS — G4733 Obstructive sleep apnea (adult) (pediatric): Secondary | ICD-10-CM | POA: Diagnosis not present

## 2021-12-04 DIAGNOSIS — G4733 Obstructive sleep apnea (adult) (pediatric): Secondary | ICD-10-CM | POA: Diagnosis not present

## 2022-01-03 DIAGNOSIS — G4733 Obstructive sleep apnea (adult) (pediatric): Secondary | ICD-10-CM | POA: Diagnosis not present

## 2022-01-08 DIAGNOSIS — I10 Essential (primary) hypertension: Secondary | ICD-10-CM | POA: Diagnosis not present

## 2022-01-08 DIAGNOSIS — E118 Type 2 diabetes mellitus with unspecified complications: Secondary | ICD-10-CM | POA: Diagnosis not present

## 2022-01-13 DIAGNOSIS — G4733 Obstructive sleep apnea (adult) (pediatric): Secondary | ICD-10-CM | POA: Diagnosis not present

## 2022-01-13 DIAGNOSIS — I10 Essential (primary) hypertension: Secondary | ICD-10-CM | POA: Diagnosis not present

## 2022-01-13 DIAGNOSIS — Z9989 Dependence on other enabling machines and devices: Secondary | ICD-10-CM | POA: Diagnosis not present

## 2022-01-13 DIAGNOSIS — E118 Type 2 diabetes mellitus with unspecified complications: Secondary | ICD-10-CM | POA: Diagnosis not present

## 2022-02-02 DIAGNOSIS — G4733 Obstructive sleep apnea (adult) (pediatric): Secondary | ICD-10-CM | POA: Diagnosis not present

## 2022-03-05 DIAGNOSIS — G4733 Obstructive sleep apnea (adult) (pediatric): Secondary | ICD-10-CM | POA: Diagnosis not present

## 2022-04-05 DIAGNOSIS — G4733 Obstructive sleep apnea (adult) (pediatric): Secondary | ICD-10-CM | POA: Diagnosis not present

## 2022-05-05 DIAGNOSIS — G4733 Obstructive sleep apnea (adult) (pediatric): Secondary | ICD-10-CM | POA: Diagnosis not present

## 2022-05-12 DIAGNOSIS — I10 Essential (primary) hypertension: Secondary | ICD-10-CM | POA: Diagnosis not present

## 2022-05-12 DIAGNOSIS — E118 Type 2 diabetes mellitus with unspecified complications: Secondary | ICD-10-CM | POA: Diagnosis not present

## 2022-05-19 DIAGNOSIS — E118 Type 2 diabetes mellitus with unspecified complications: Secondary | ICD-10-CM | POA: Diagnosis not present

## 2022-05-19 DIAGNOSIS — N401 Enlarged prostate with lower urinary tract symptoms: Secondary | ICD-10-CM | POA: Diagnosis not present

## 2022-05-19 DIAGNOSIS — R350 Frequency of micturition: Secondary | ICD-10-CM | POA: Diagnosis not present

## 2022-05-19 DIAGNOSIS — L720 Epidermal cyst: Secondary | ICD-10-CM | POA: Diagnosis not present

## 2022-05-19 DIAGNOSIS — G4733 Obstructive sleep apnea (adult) (pediatric): Secondary | ICD-10-CM | POA: Diagnosis not present

## 2022-05-19 DIAGNOSIS — Z2821 Immunization not carried out because of patient refusal: Secondary | ICD-10-CM | POA: Diagnosis not present

## 2022-05-19 DIAGNOSIS — I10 Essential (primary) hypertension: Secondary | ICD-10-CM | POA: Diagnosis not present

## 2022-06-05 DIAGNOSIS — G4733 Obstructive sleep apnea (adult) (pediatric): Secondary | ICD-10-CM | POA: Diagnosis not present

## 2022-07-05 DIAGNOSIS — G4733 Obstructive sleep apnea (adult) (pediatric): Secondary | ICD-10-CM | POA: Diagnosis not present

## 2022-08-05 DIAGNOSIS — G4733 Obstructive sleep apnea (adult) (pediatric): Secondary | ICD-10-CM | POA: Diagnosis not present

## 2022-09-05 DIAGNOSIS — G4733 Obstructive sleep apnea (adult) (pediatric): Secondary | ICD-10-CM | POA: Diagnosis not present

## 2022-09-21 DIAGNOSIS — E118 Type 2 diabetes mellitus with unspecified complications: Secondary | ICD-10-CM | POA: Diagnosis not present

## 2022-09-21 DIAGNOSIS — I10 Essential (primary) hypertension: Secondary | ICD-10-CM | POA: Diagnosis not present

## 2022-09-28 DIAGNOSIS — G4733 Obstructive sleep apnea (adult) (pediatric): Secondary | ICD-10-CM | POA: Diagnosis not present

## 2022-09-28 DIAGNOSIS — E118 Type 2 diabetes mellitus with unspecified complications: Secondary | ICD-10-CM | POA: Diagnosis not present

## 2022-09-28 DIAGNOSIS — R350 Frequency of micturition: Secondary | ICD-10-CM | POA: Diagnosis not present

## 2022-09-28 DIAGNOSIS — N529 Male erectile dysfunction, unspecified: Secondary | ICD-10-CM | POA: Diagnosis not present

## 2022-09-28 DIAGNOSIS — I10 Essential (primary) hypertension: Secondary | ICD-10-CM | POA: Diagnosis not present

## 2022-09-28 DIAGNOSIS — N401 Enlarged prostate with lower urinary tract symptoms: Secondary | ICD-10-CM | POA: Diagnosis not present

## 2022-10-04 DIAGNOSIS — G4733 Obstructive sleep apnea (adult) (pediatric): Secondary | ICD-10-CM | POA: Diagnosis not present

## 2022-10-13 ENCOUNTER — Ambulatory Visit: Payer: Self-pay | Admitting: Urology

## 2022-11-04 ENCOUNTER — Ambulatory Visit: Payer: 59 | Admitting: Urology

## 2022-11-04 ENCOUNTER — Encounter: Payer: Self-pay | Admitting: Urology

## 2022-11-04 VITALS — BP 164/97 | HR 85 | Ht 72.0 in | Wt 257.0 lb

## 2022-11-04 DIAGNOSIS — N401 Enlarged prostate with lower urinary tract symptoms: Secondary | ICD-10-CM | POA: Diagnosis not present

## 2022-11-04 DIAGNOSIS — N529 Male erectile dysfunction, unspecified: Secondary | ICD-10-CM | POA: Diagnosis not present

## 2022-11-04 DIAGNOSIS — R350 Frequency of micturition: Secondary | ICD-10-CM

## 2022-11-04 DIAGNOSIS — N400 Enlarged prostate without lower urinary tract symptoms: Secondary | ICD-10-CM | POA: Diagnosis not present

## 2022-11-04 DIAGNOSIS — G4733 Obstructive sleep apnea (adult) (pediatric): Secondary | ICD-10-CM | POA: Diagnosis not present

## 2022-11-04 LAB — URINALYSIS, COMPLETE
Bilirubin, UA: NEGATIVE
Ketones, UA: NEGATIVE
Leukocytes,UA: NEGATIVE
Nitrite, UA: NEGATIVE
Protein,UA: NEGATIVE
RBC, UA: NEGATIVE
Specific Gravity, UA: 1.015 (ref 1.005–1.030)
Urobilinogen, Ur: 0.2 mg/dL (ref 0.2–1.0)
pH, UA: 7.5 (ref 5.0–7.5)

## 2022-11-04 LAB — MICROSCOPIC EXAMINATION: Bacteria, UA: NONE SEEN

## 2022-11-04 LAB — BLADDER SCAN AMB NON-IMAGING: Scan Result: 76

## 2022-11-04 MED ORDER — SILDENAFIL CITRATE 100 MG PO TABS: ORAL_TABLET | ORAL | 0 refills | Status: DC

## 2022-11-04 NOTE — Progress Notes (Signed)
I, Andre Carr,acting as a scribe for Andre Sons, MD.,have documented all relevant documentation on the behalf of Andre Sons, MD,as directed by  Andre Sons, MD while in the presence of Andre Sons, MD.   11/04/22 3:02 PM   Andre Carr 03/09/1966 [id number]  Referring provider: Kirk Ruths, MD [place],  [place] [address]  Chief Complaint  Patient presents with   New Patient (Initial Visit)    HPI: Andre Carr is a 57 y.o. male who is referred for BPH with urinary frequency and erectile dysfunction.  He has been on Tadalafil 5 mg daily for approximately five months for ED, with partial erections that are not firm enough for penetration  He does have urinary frequency and feels his voiding symptoms have improved slightly on Tadalafil  Voiding symptoms are not that bothersome Organic risk factors include diabetes, hypertension, and antihypertensive medications   Surgical History: Past Surgical History:  Procedure Laterality Date   INCISION AND DRAINAGE ABSCESS N/A 07/04/2019   Procedure: INCISION AND DRAINAGE ABSCESS;  Surgeon: Andre Sprague, DO;  Location: [facility] ORS;  Service: General;  Laterality: N/A;    Home Medications:  Allergies as of 11/04/2022   No Known Allergies      Medication List        Accurate as of November 04, 2022  3:02 PM. If you have any questions, ask your nurse or doctor.          STOP taking these medications    HYDROcodone-acetaminophen 5-325 MG tablet Commonly known as: Norco Stopped by: Andre Sons, MD   tadalafil 5 MG tablet Commonly known as: CIALIS Stopped by: Andre Sons, MD       TAKE these medications    Acetylcarnitine HCl 500 MG Caps Take by mouth.   Alpha-Lipoic Acid 600 MG Caps Take by mouth.   carvedilol 25 MG tablet Commonly known as: COREG Take 25 mg by mouth 2 (two) times daily.   CoQ10 100 MG Caps Take by mouth.   ibuprofen 800 MG  tablet Commonly known as: ADVIL Take 1 tablet (800 mg total) by mouth every 8 (eight) hours as needed for mild pain or moderate pain.   Jardiance 10 MG Tabs tablet Generic drug: empagliflozin Take 10 mg by mouth daily.   magnesium gluconate 54mg /70ml syringe Take by mouth.   MULTIVITAMIN ADULT PO Take by mouth.   pantoprazole 40 MG tablet Commonly known as: PROTONIX Take 40 mg by mouth daily.   sildenafil 100 MG tablet Commonly known as: VIAGRA Take 1 tablet 1 hour prior to intercourse. Started by: Andre Sons, MD   Trulicity 4.5 0000000 Sopn Generic drug: Dulaglutide Inject into the skin.   VITAMIN B + C COMPLEX PO Take 1 tablet by mouth daily.        Allergies: No Known Allergies  Social History:  has no history on file for tobacco use, alcohol use, and drug use.   Physical Exam: BP (!) 164/97   Pulse 85   Ht 6' (1.829 m)   Wt 257 lb (116.6 kg)   BMI 34.86 kg/m   Constitutional:  Alert and oriented, No acute distress. HEENT: [place] AT, moist mucus membranes.  Trachea midline, no masses. Cardiovascular: No clubbing, cyanosis, or edema. Respiratory: Normal respiratory effort, no increased work of breathing. GI: Abdomen is soft, nontender, nondistended, no abdominal masses GU: phallus without lesions, no penile plaques, testes  descended bilaterally without masses of tenderness. Prostate 30 grams, smooth without nodules. Skin: No rashes, bruises or suspicious lesions. Neurologic: Grossly intact, no focal deficits, moving all 4 extremities. Psychiatric: Normal mood and affect.  Assessment & Plan:    Erectile dysfunction Significant organic risk factors No significant improvement on daily Tadalafil Trial Sildenafil 100 mg one hour prior to intercourse. Most common side effect of headache and flushing was discussed Second line options were discussed including vacuum erection device and intracavernosal injections. He will call back if interested in pursuing  intracavernosal injections for a PA training visit  Urinary frequency Symptoms currently not bothersome enough that he desires additional intervention   [place] [address] [place], [place] [address] [phone]

## 2022-12-04 DIAGNOSIS — G4733 Obstructive sleep apnea (adult) (pediatric): Secondary | ICD-10-CM | POA: Diagnosis not present

## 2023-02-24 ENCOUNTER — Other Ambulatory Visit: Payer: Self-pay | Admitting: Urology
